# Patient Record
Sex: Male | Born: 1995 | Hispanic: Yes | Marital: Single | Smoking: Never smoker
Health system: Southern US, Community
[De-identification: ages and names within clinical notes are randomized; demographics above are authoritative.]

---

## 2016-05-08 ENCOUNTER — Ambulatory Visit (HOSPITAL_COMMUNITY)
Admission: EM | Admit: 2016-05-08 | Discharge: 2016-05-08 | Disposition: A | Discharge status: Home / Self Care | Payer: Self-pay

## 2016-05-08 ENCOUNTER — Encounter (HOSPITAL_COMMUNITY): Payer: Self-pay | Admitting: Emergency Medicine

## 2016-05-08 DIAGNOSIS — R040 Epistaxis: Secondary | ICD-10-CM

## 2016-05-08 MED ORDER — EPINEPHRINE HCL 1 MG/ML IJ SOLN
INTRAMUSCULAR | Status: AC
  Filled 2016-05-08: qty 2

## 2016-05-08 MED ORDER — EPINEPHRINE HCL 0.1 MG/ML IJ SOSY
PREFILLED_SYRINGE | INTRAMUSCULAR | Status: AC
  Filled 2016-05-08: qty 10

## 2016-05-08 NOTE — ED Triage Notes (Signed)
Pt c/o nose bleeds onset yest that will last 10 min... Bleeding under control at the moment... States he vomited earlier today due to blood.   Denies inj/trauma... Works as a Printmakerroofer/construction  Also reports use of marijuana and crack/cocaine... Last use was 3 weeks ago  A&O x4... NAD

## 2016-05-08 NOTE — ED Provider Notes (Signed)
CSN: [id number]     Arrival date & time 05/08/16  1605 History   First MD Initiated Contact with Patient 05/08/16 1639     Chief Complaint  Patient presents with  . Epistaxis   (Consider location/radiation/quality/duration/timing/severity/associated sxs/prior Treatment) HPI Patient is a [age] year old male works as a Designer, fashion/clothingroofer has had a couple of nosebleeds over the past few days. States that they last about 10 minutes with pressure until it Which can be several hours. He is not having the symptoms while at home. Patient does admit to recent use of cocaine. Does not have any pain at this time. No other symptoms except an episode of vomiting blood from holding his head back and swallowing blood from the nosebleed earlier. History reviewed. No pertinent past medical history. History reviewed. No pertinent surgical history. History reviewed. No pertinent family history. Social History  Substance Use Topics  . Smoking status: Never Smoker  . Smokeless tobacco: Never Used  . Alcohol use No    Review of Systems  Allergies  Review of patient's allergies indicates no known allergies.  Home Medications   Prior to Admission medications   Not on File   Meds Ordered and Administered this Visit  Medications - No data to display  BP 141/81 (BP Location: Left Arm)   Pulse 73   Temp 98.1 F (36.7 C) (Oral)   Resp 18   SpO2 100%  No data found.   Physical Exam NURSES NOTES AND VITAL SIGNS REVIEWED. CONSTITUTIONAL: Well developed, well nourished, no acute distress HEENT: normocephalic, atraumatic, small amount of blood from both nostrils. Both anterior Kiesselbach plexus bleeding. EYES: Conjunctiva normal NECK:normal ROM, supple, no adenopathy PULMONARY:No respiratory distress, normal effort ABDOMINAL: Soft, ND, NT BS+, No CVAT MUSCULOSKELETAL: Normal ROM of all extremities,  SKIN: warm and dry without rash PSYCHIATRIC: Mood and affect, behavior are normal  Urgent Care Course    Clinical Course    Procedures (including critical care time)  Labs Review Labs Reviewed - No data to display  Imaging Review No results found.   Visual Acuity Review  Right Eye Distance:   Left Eye Distance:   Bilateral Distance:    Right Eye Near:   Left Eye Near:    Bilateral Near:        Epinephrine pledgets are placed in nostrils, on re-exam vessels are not as engorged. No active bleeding Discharged home in stable condition.  MDM   1. Epistaxis   2. Anterior epistaxis     Patient is reassured that there are no issues that require transfer to higher level of care at this time or additional tests. Patient is advised to continue home symptomatic treatment. Patient is advised that if there are new or worsening symptoms to attend the emergency department, contact primary care provider, or return to UC. Instructions of care provided discharged home in stable condition.    THIS NOTE WAS GENERATED USING A VOICE RECOGNITION SOFTWARE PROGRAM. ALL REASONABLE EFFORTS  WERE MADE TO PROOFREAD THIS DOCUMENT FOR ACCURACY.  I have verbally reviewed the discharge instructions with the patient. A printed AVS was given to the patient.  All questions were answered prior to discharge.      Tharon AquasFrank C Macalister Arnaud, PA 05/08/16 1747

## 2016-05-08 NOTE — Discharge Instructions (Signed)
Su sangrado de la nariz es porque el tejido en la nariz es seco / vaselina uso para Pharmacologistmantener las fosas nasales hmedo. No use cocana.  your nosebleed is because the tissue in your nose is dry/  use vaseline to keep your nostrils moist.  do not use cocaine.

## 2016-05-08 NOTE — ED Notes (Signed)
Discharged by frank patrick

## 2017-12-19 ENCOUNTER — Emergency Department (HOSPITAL_COMMUNITY): Payer: Self-pay

## 2017-12-19 ENCOUNTER — Encounter (HOSPITAL_COMMUNITY): Payer: Self-pay | Admitting: Emergency Medicine

## 2017-12-19 ENCOUNTER — Emergency Department (HOSPITAL_COMMUNITY)
Admission: EM | Admit: 2017-12-19 | Discharge: 2017-12-20 | Disposition: A | Discharge status: Home / Self Care | Payer: Self-pay | Attending: Emergency Medicine | Admitting: Emergency Medicine

## 2017-12-19 DIAGNOSIS — Y939 Activity, unspecified: Secondary | ICD-10-CM | POA: Insufficient documentation

## 2017-12-19 DIAGNOSIS — S0181XA Laceration without foreign body of other part of head, initial encounter: Secondary | ICD-10-CM | POA: Insufficient documentation

## 2017-12-19 DIAGNOSIS — S022XXA Fracture of nasal bones, initial encounter for closed fracture: Secondary | ICD-10-CM | POA: Insufficient documentation

## 2017-12-19 DIAGNOSIS — R0602 Shortness of breath: Secondary | ICD-10-CM | POA: Insufficient documentation

## 2017-12-19 DIAGNOSIS — Y999 Unspecified external cause status: Secondary | ICD-10-CM | POA: Insufficient documentation

## 2017-12-19 DIAGNOSIS — F10929 Alcohol use, unspecified with intoxication, unspecified: Secondary | ICD-10-CM

## 2017-12-19 DIAGNOSIS — Y929 Unspecified place or not applicable: Secondary | ICD-10-CM | POA: Insufficient documentation

## 2017-12-19 DIAGNOSIS — Z23 Encounter for immunization: Secondary | ICD-10-CM | POA: Insufficient documentation

## 2017-12-19 DIAGNOSIS — F10129 Alcohol abuse with intoxication, unspecified: Secondary | ICD-10-CM | POA: Insufficient documentation

## 2017-12-19 DIAGNOSIS — S301XXA Contusion of abdominal wall, initial encounter: Secondary | ICD-10-CM | POA: Insufficient documentation

## 2017-12-19 DIAGNOSIS — S0990XA Unspecified injury of head, initial encounter: Secondary | ICD-10-CM | POA: Insufficient documentation

## 2017-12-19 DIAGNOSIS — Y906 Blood alcohol level of 120-199 mg/100 ml: Secondary | ICD-10-CM | POA: Insufficient documentation

## 2017-12-19 LAB — CBC WITH DIFFERENTIAL/PLATELET
BASOS ABS: 0.1 10*3/uL (ref 0.0–0.1)
BASOS PCT: 0 %
Eosinophils Absolute: 0.2 10*3/uL (ref 0.0–0.7)
Eosinophils Relative: 1 %
HEMATOCRIT: 49 % (ref 39.0–52.0)
HEMOGLOBIN: 16.6 g/dL (ref 13.0–17.0)
Lymphocytes Relative: 11 %
Lymphs Abs: 1.9 10*3/uL (ref 0.7–4.0)
MCH: 29.5 pg (ref 26.0–34.0)
MCHC: 33.9 g/dL (ref 30.0–36.0)
MCV: 87.2 fL (ref 78.0–100.0)
MONOS PCT: 5 %
Monocytes Absolute: 0.8 10*3/uL (ref 0.1–1.0)
NEUTROS ABS: 14.6 10*3/uL — AB (ref 1.7–7.7)
NEUTROS PCT: 83 %
Platelets: 263 10*3/uL (ref 150–400)
RBC: 5.62 MIL/uL (ref 4.22–5.81)
RDW: 13 % (ref 11.5–15.5)
WBC: 17.4 10*3/uL — AB (ref 4.0–10.5)

## 2017-12-19 LAB — COMPREHENSIVE METABOLIC PANEL
ALBUMIN: 4.4 g/dL (ref 3.5–5.0)
ALK PHOS: 98 U/L (ref 38–126)
ALT: 20 U/L (ref 17–63)
AST: 37 U/L (ref 15–41)
Anion gap: 12 (ref 5–15)
BILIRUBIN TOTAL: 0.7 mg/dL (ref 0.3–1.2)
BUN: 12 mg/dL (ref 6–20)
CALCIUM: 9.2 mg/dL (ref 8.9–10.3)
CO2: 26 mmol/L (ref 22–32)
Chloride: 103 mmol/L (ref 101–111)
Creatinine, Ser: 0.92 mg/dL (ref 0.61–1.24)
GFR calc Af Amer: >60 mL/min (ref >60)
GFR calc non Af Amer: >60 mL/min (ref >60)
GLUCOSE: 107 mg/dL — AB (ref 65–99)
Potassium: 4.2 mmol/L (ref 3.5–5.1)
SODIUM: 141 mmol/L (ref 135–145)
TOTAL PROTEIN: 7.7 g/dL (ref 6.5–8.1)

## 2017-12-19 LAB — ETHANOL: Alcohol, Ethyl (B): 130 mg/dL — ABNORMAL HIGH (ref <10)

## 2017-12-19 MED ORDER — MORPHINE SULFATE (PF) 4 MG/ML IV SOLN: 4.0000 mg | Freq: Once | INTRAVENOUS | Status: DC

## 2017-12-19 MED ORDER — SODIUM CHLORIDE 0.9 % IV BOLUS
1000.0000 mL | Freq: Once | INTRAVENOUS | Status: AC
  Administered 2017-12-19: 1000 mL via INTRAVENOUS

## 2017-12-19 MED ORDER — IOPAMIDOL (ISOVUE-300) INJECTION 61%
INTRAVENOUS | Status: AC
  Administered 2017-12-19: 100 mL
  Filled 2017-12-19: qty 100

## 2017-12-19 MED ORDER — HYDROCODONE-ACETAMINOPHEN 5-325 MG PO TABS: 2.0000 | ORAL_TABLET | Freq: Once | ORAL | Status: DC

## 2017-12-19 MED ORDER — TETANUS-DIPHTH-ACELL PERTUSSIS 5-2.5-18.5 LF-MCG/0.5 IM SUSP
0.5000 mL | Freq: Once | INTRAMUSCULAR | Status: AC
  Administered 2017-12-20: 0.5 mL via INTRAMUSCULAR
  Filled 2017-12-19: qty 0.5
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
## 2017-12-19 NOTE — ED Notes (Signed)
Patient transported to CT 
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
## 2017-12-19 NOTE — ED Notes (Signed)
Pt states he feel okay to leave. Asking to leave. This RN explained to pt he needs to stay and have all scans completed.

## 2017-12-20 MED ORDER — LIDOCAINE-EPINEPHRINE-TETRACAINE (LET) SOLUTION
3.0000 mL | Freq: Once | NASAL | Status: AC
  Administered 2017-12-20: 3 mL via TOPICAL
  Filled 2017-12-20: qty 3

## 2017-12-20 MED ORDER — LIDOCAINE-EPINEPHRINE (PF) 2 %-1:200000 IJ SOLN: 20.0000 mL | Freq: Once | INTRAMUSCULAR | Status: DC

## 2017-12-20 MED ORDER — AMOXICILLIN-POT CLAVULANATE 875-125 MG PO TABS: 1.0000 | ORAL_TABLET | Freq: Two times a day (BID) | ORAL | 0 refills | Status: DC

## 2017-12-20 MED ORDER — DICLOFENAC SODIUM ER 100 MG PO TB24: 100.0000 mg | ORAL_TABLET | Freq: Every day | ORAL | 0 refills | Status: DC

## 2017-12-20 NOTE — ED Notes (Signed)
Pt given two cups of water, swallowed successfully with no nausea and no vomiting.

## 2017-12-20 NOTE — ED Notes (Signed)
Ortho tech at bedside 

## 2017-12-20 NOTE — ED Notes (Signed)
Pt left with cab voucher, so signature obtained. Discharged by Lamount CohenJessica Charge RN.
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
## 2017-12-20 NOTE — ED Notes (Signed)
Regular diet ordered.
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
## 2019-10-07 ENCOUNTER — Other Ambulatory Visit: Payer: Self-pay

## 2019-10-07 ENCOUNTER — Emergency Department (HOSPITAL_COMMUNITY): Payer: Self-pay

## 2019-10-07 ENCOUNTER — Emergency Department (HOSPITAL_COMMUNITY)
Admission: EM | Admit: 2019-10-07 | Discharge: 2019-10-07 | Disposition: A | Discharge status: Home / Self Care | Payer: Self-pay | Attending: Emergency Medicine | Admitting: Emergency Medicine

## 2019-10-07 DIAGNOSIS — S169XXA Unspecified injury of muscle, fascia and tendon at neck level, initial encounter: Secondary | ICD-10-CM | POA: Insufficient documentation

## 2019-10-07 DIAGNOSIS — F1010 Alcohol abuse, uncomplicated: Secondary | ICD-10-CM

## 2019-10-07 DIAGNOSIS — S0990XA Unspecified injury of head, initial encounter: Secondary | ICD-10-CM | POA: Insufficient documentation

## 2019-10-07 DIAGNOSIS — S161XXA Strain of muscle, fascia and tendon at neck level, initial encounter: Secondary | ICD-10-CM

## 2019-10-07 DIAGNOSIS — Z79899 Other long term (current) drug therapy: Secondary | ICD-10-CM | POA: Insufficient documentation

## 2019-10-07 DIAGNOSIS — W109XXA Fall (on) (from) unspecified stairs and steps, initial encounter: Secondary | ICD-10-CM | POA: Insufficient documentation

## 2019-10-07 DIAGNOSIS — Y92009 Unspecified place in unspecified non-institutional (private) residence as the place of occurrence of the external cause: Secondary | ICD-10-CM | POA: Insufficient documentation

## 2019-10-07 DIAGNOSIS — Y999 Unspecified external cause status: Secondary | ICD-10-CM | POA: Insufficient documentation

## 2019-10-07 DIAGNOSIS — Y939 Activity, unspecified: Secondary | ICD-10-CM | POA: Insufficient documentation

## 2019-10-07 DIAGNOSIS — F10129 Alcohol abuse with intoxication, unspecified: Secondary | ICD-10-CM | POA: Insufficient documentation

## 2019-10-07 DIAGNOSIS — W19XXXA Unspecified fall, initial encounter: Secondary | ICD-10-CM
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
## 2019-10-07 NOTE — ED Notes (Signed)
Pt was offered ice for his head, but he declined it.

## 2019-10-07 NOTE — ED Notes (Signed)
Patient transported to CT 

## 2019-10-07 NOTE — ED Notes (Signed)
X-ray at bedside

## 2024-08-31 ENCOUNTER — Other Ambulatory Visit: Payer: Self-pay

## 2024-08-31 ENCOUNTER — Emergency Department
Admission: EM | Admit: 2024-08-31 | Discharge: 2024-08-31 | Disposition: A | Discharge status: Home / Self Care | Payer: Self-pay | Attending: Emergency Medicine | Admitting: Emergency Medicine

## 2024-08-31 DIAGNOSIS — K2100 Gastro-esophageal reflux disease with esophagitis, without bleeding: Secondary | ICD-10-CM | POA: Insufficient documentation

## 2024-08-31 LAB — COMPREHENSIVE METABOLIC PANEL WITH GFR
ALT: 11 U/L (ref 0–44)
AST: 18 U/L (ref 15–41)
Albumin: 4.7 g/dL (ref 3.5–5.0)
Alkaline Phosphatase: 82 U/L (ref 38–126)
Anion gap: 10 (ref 5–15)
BUN: 14 mg/dL (ref 6–20)
CO2: 26 mmol/L (ref 22–32)
Calcium: 9.6 mg/dL (ref 8.9–10.3)
Chloride: 102 mmol/L (ref 98–111)
Creatinine, Ser: 0.88 mg/dL (ref 0.61–1.24)
GFR, Estimated: >60 mL/min (ref >60)
Glucose, Bld: 77 mg/dL (ref 70–99)
Potassium: 3.9 mmol/L (ref 3.5–5.1)
Sodium: 137 mmol/L (ref 135–145)
Total Bilirubin: 0.5 mg/dL (ref 0.0–1.2)
Total Protein: 7.2 g/dL (ref 6.5–8.1)

## 2024-08-31 LAB — CBC
HCT: 46.9 % (ref 39.0–52.0)
Hemoglobin: 15.4 g/dL (ref 13.0–17.0)
MCH: 27.9 pg (ref 26.0–34.0)
MCHC: 32.8 g/dL (ref 30.0–36.0)
MCV: 85 fL (ref 80.0–100.0)
Platelets: 256 K/uL (ref 150–400)
RBC: 5.52 MIL/uL (ref 4.22–5.81)
RDW: 12.5 % (ref 11.5–15.5)
WBC: 6.6 K/uL (ref 4.0–10.5)
nRBC: 0 % (ref 0.0–0.2)

## 2024-08-31 LAB — LIPASE, BLOOD: Lipase: 34 U/L (ref 11–51)

## 2024-08-31 LAB — URINALYSIS, ROUTINE W REFLEX MICROSCOPIC
Bilirubin Urine: NEGATIVE
Glucose, UA: NEGATIVE mg/dL
Hgb urine dipstick: NEGATIVE
Ketones, ur: NEGATIVE mg/dL
Leukocytes,Ua: NEGATIVE
Nitrite: NEGATIVE
Protein, ur: NEGATIVE mg/dL
Specific Gravity, Urine: 1.015 (ref 1.005–1.030)
pH: 6 (ref 5.0–8.0)

## 2024-08-31 MED ORDER — ALUM & MAG HYDROXIDE-SIMETH 200-200-20 MG/5ML PO SUSP
30.0000 mL | Freq: Once | ORAL | Status: AC
  Administered 2024-08-31: 30 mL via ORAL
  Filled 2024-08-31: qty 30

## 2024-08-31 MED ORDER — LIDOCAINE VISCOUS HCL 2 % MT SOLN
15.0000 mL | Freq: Once | OROMUCOSAL | Status: AC
  Administered 2024-08-31: 15 mL via ORAL
  Filled 2024-08-31: qty 15

## 2024-08-31 MED ORDER — PANTOPRAZOLE SODIUM 40 MG PO TBEC: 40.0000 mg | DELAYED_RELEASE_TABLET | Freq: Every day | ORAL | 1 refills | Status: AC

## 2024-08-31 NOTE — ED Triage Notes (Signed)
 Pt arrives via POV with c/o abdominal pain that started a week ago that is epigastric and feels like a burning sensation and when they drink water it gets better. Pain feels a little better when they eat. Pt eats a lot of spicy and acidic foods. Pt states that they have never felt anything like this before, denies vomiting but does endorse some nausea that comes and goes. Pt is A&Ox4 and ambulatory during triage.   Pt states that their BM are less solid than normal, denies any dark, tarry stools.

## 2024-08-31 NOTE — Discharge Instructions (Signed)
 Follow-up with your regular doctor if not improved in 1 week or for an appointment with the GI specialist.  Return emergency department for worsening.  Take medication as prescribed.
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
